# Patient Record
Sex: Male | Born: 1965 | Hispanic: Yes | Marital: Married | Smoking: Former smoker
Health system: Southern US, Community
[De-identification: ages and names within clinical notes are randomized; demographics above are authoritative.]

## PROBLEM LIST (undated history)

## (undated) DIAGNOSIS — S065XAA Traumatic subdural hemorrhage with loss of consciousness status unknown, initial encounter: Secondary | ICD-10-CM

## (undated) DIAGNOSIS — G4733 Obstructive sleep apnea (adult) (pediatric): Secondary | ICD-10-CM

## (undated) DIAGNOSIS — R569 Unspecified convulsions: Secondary | ICD-10-CM

## (undated) DIAGNOSIS — J449 Chronic obstructive pulmonary disease, unspecified: Secondary | ICD-10-CM

## (undated) DIAGNOSIS — I10 Essential (primary) hypertension: Secondary | ICD-10-CM

## (undated) DIAGNOSIS — S065X9A Traumatic subdural hemorrhage with loss of consciousness of unspecified duration, initial encounter: Secondary | ICD-10-CM

## (undated) DIAGNOSIS — Z9989 Dependence on other enabling machines and devices: Secondary | ICD-10-CM

## (undated) HISTORY — PX: BRAIN SURGERY: SHX531

## (undated) HISTORY — PX: OTHER SURGICAL HISTORY: SHX169

---

## 2016-03-24 ENCOUNTER — Emergency Department (HOSPITAL_COMMUNITY)
Admission: EM | Admit: 2016-03-24 | Discharge: 2016-03-24 | Disposition: A | Discharge status: Home / Self Care | Payer: Self-pay | Attending: Emergency Medicine | Admitting: Emergency Medicine

## 2016-03-24 ENCOUNTER — Other Ambulatory Visit: Payer: Self-pay

## 2016-03-24 ENCOUNTER — Encounter (HOSPITAL_COMMUNITY): Payer: Self-pay | Admitting: Emergency Medicine

## 2016-03-24 ENCOUNTER — Emergency Department (HOSPITAL_COMMUNITY): Payer: Self-pay

## 2016-03-24 DIAGNOSIS — I10 Essential (primary) hypertension: Secondary | ICD-10-CM | POA: Insufficient documentation

## 2016-03-24 DIAGNOSIS — J441 Chronic obstructive pulmonary disease with (acute) exacerbation: Secondary | ICD-10-CM

## 2016-03-24 DIAGNOSIS — J449 Chronic obstructive pulmonary disease, unspecified: Secondary | ICD-10-CM | POA: Insufficient documentation

## 2016-03-24 DIAGNOSIS — Z8669 Personal history of other diseases of the nervous system and sense organs: Secondary | ICD-10-CM | POA: Insufficient documentation

## 2016-03-24 DIAGNOSIS — Z87891 Personal history of nicotine dependence: Secondary | ICD-10-CM | POA: Insufficient documentation

## 2016-03-24 HISTORY — DX: Essential (primary) hypertension: I10

## 2016-03-24 HISTORY — DX: Unspecified convulsions: R56.9

## 2016-03-24 HISTORY — DX: Chronic obstructive pulmonary disease, unspecified: J44.9

## 2016-03-24 LAB — BASIC METABOLIC PANEL
Anion gap: 7 (ref 5–15)
BUN: 21 mg/dL — ABNORMAL HIGH (ref 6–20)
CALCIUM: 8.9 mg/dL (ref 8.9–10.3)
CHLORIDE: 103 mmol/L (ref 101–111)
CO2: 27 mmol/L (ref 22–32)
CREATININE: 0.73 mg/dL (ref 0.61–1.24)
GFR calc Af Amer: >60 mL/min (ref >60)
GFR calc non Af Amer: >60 mL/min (ref >60)
GLUCOSE: 124 mg/dL — AB (ref 65–99)
Potassium: 3.5 mmol/L (ref 3.5–5.1)
Sodium: 137 mmol/L (ref 135–145)

## 2016-03-24 LAB — D-DIMER, QUANTITATIVE: D-Dimer, Quant: <0.27 ug/mL-FEU (ref 0.00–0.50)

## 2016-03-24 LAB — CBC WITH DIFFERENTIAL/PLATELET
BASOS PCT: 0 %
Basophils Absolute: 0 10*3/uL (ref 0.0–0.1)
EOS ABS: 0.6 10*3/uL (ref 0.0–0.7)
Eosinophils Relative: 5 %
HEMATOCRIT: 40.4 % (ref 39.0–52.0)
Hemoglobin: 12.9 g/dL — ABNORMAL LOW (ref 13.0–17.0)
LYMPHS ABS: 2.7 10*3/uL (ref 0.7–4.0)
Lymphocytes Relative: 24 %
MCH: 26.3 pg (ref 26.0–34.0)
MCHC: 31.9 g/dL (ref 30.0–36.0)
MCV: 82.4 fL (ref 78.0–100.0)
MONO ABS: 1.1 10*3/uL — AB (ref 0.1–1.0)
MONOS PCT: 10 %
Neutro Abs: 6.7 10*3/uL (ref 1.7–7.7)
Neutrophils Relative %: 61 %
Platelets: 186 10*3/uL (ref 150–400)
RBC: 4.9 MIL/uL (ref 4.22–5.81)
RDW: 14.2 % (ref 11.5–15.5)
WBC: 11 10*3/uL — ABNORMAL HIGH (ref 4.0–10.5)

## 2016-03-24 LAB — TROPONIN I

## 2016-03-24 MED ORDER — ALBUTEROL SULFATE (2.5 MG/3ML) 0.083% IN NEBU
5.0000 mg | INHALATION_SOLUTION | Freq: Once | RESPIRATORY_TRACT | Status: AC
  Administered 2016-03-24: 5 mg via RESPIRATORY_TRACT
  Filled 2016-03-24: qty 6

## 2016-03-24 MED ORDER — ALBUTEROL SULFATE (2.5 MG/3ML) 0.083% IN NEBU
2.5000 mg | INHALATION_SOLUTION | Freq: Once | RESPIRATORY_TRACT | Status: AC
  Administered 2016-03-24: 2.5 mg via RESPIRATORY_TRACT
  Filled 2016-03-24: qty 3

## 2016-03-24 MED ORDER — AEROCHAMBER PLUS FLO-VU MEDIUM MISC
1.0000 | Freq: Once | Status: AC
  Administered 2016-03-24: 1
  Filled 2016-03-24 (×2): qty 1

## 2016-03-24 MED ORDER — PREDNISONE 10 MG (21) PO TBPK: 10.0000 mg | ORAL_TABLET | Freq: Every day | ORAL | Status: DC

## 2016-03-24 MED ORDER — IPRATROPIUM-ALBUTEROL 0.5-2.5 (3) MG/3ML IN SOLN
3.0000 mL | Freq: Once | RESPIRATORY_TRACT | Status: AC
  Administered 2016-03-24: 3 mL via RESPIRATORY_TRACT
  Filled 2016-03-24: qty 3

## 2016-03-24 MED ORDER — METHYLPREDNISOLONE SODIUM SUCC 125 MG IJ SOLR
125.0000 mg | Freq: Once | INTRAMUSCULAR | Status: AC
  Administered 2016-03-24: 125 mg via INTRAVENOUS
  Filled 2016-03-24: qty 2

## 2016-03-24 MED ORDER — ALBUTEROL SULFATE 0.63 MG/3ML IN NEBU: 3.0000 mL | INHALATION_SOLUTION | Freq: Four times a day (QID) | RESPIRATORY_TRACT | Status: AC | PRN

## 2016-03-24 MED ORDER — ALBUTEROL SULFATE HFA 108 (90 BASE) MCG/ACT IN AERS
2.0000 | INHALATION_SPRAY | Freq: Once | RESPIRATORY_TRACT | Status: AC
  Administered 2016-03-24: 2 via RESPIRATORY_TRACT
  Filled 2016-03-24: qty 6.7

## 2016-03-24 NOTE — ED Notes (Signed)
Pt c/o shortness of breath for about a week ago, [place]. Pt has COPD. Denies chest pain. Pt states he used albuterol neb this morning that helped but when he ambulates it worsens.

## 2016-03-24 NOTE — ED Notes (Signed)
MD at bedside. 

## 2016-03-24 NOTE — ED Provider Notes (Signed)
CSN: [id number]     Arrival date & time 03/24/16  0848 History   First MD Initiated Contact with Patient 03/24/16 [phone]     Chief Complaint  Patient presents with  . Shortness of Breath   PT IS HERE WITH SOB.  HE DROVE HERE TO  FROM [place] FOR WORK A FEW DAYS AGO.  PT HAS COPD AND BROUGHT HIS INHALERS WITH HIM.  THE PT SAID THAT HIS ALBUTEROL NEB HAS HELPED, BUT HE IS STILL SOB ESPECIALLY WHEN HE STANDS UP.  (Consider location/radiation/quality/duration/timing/severity/associated sxs/prior Treatment) Patient is a 50 y.o. male presenting with shortness of breath. The history is provided by the patient.  Shortness of Breath Severity:  Moderate Onset quality:  Gradual Timing:  Constant Progression:  Worsening Chronicity:  Recurrent Context: activity   Relieved by:  Nothing Worsened by:  Exertion Ineffective treatments:  Inhaler Associated symptoms: wheezing     Past Medical History  Diagnosis Date  . COPD (chronic obstructive pulmonary disease) (HCC)   . Hypertension   . Seizures (HCC)    No past surgical history on file. CRANIOTOMY No family history on file. Social History  Substance Use Topics  . Smoking status: Former Games developermoker  . Smokeless tobacco: None  . Alcohol Use: None    Review of Systems  Respiratory: Positive for shortness of breath and wheezing.   All other systems reviewed and are negative.     Allergies  Iodine and Penicillins  Home Medications   Prior to Admission medications   Medication Sig Start Date End Date Taking? Authorizing Provider  lisinopril (PRINIVIL,ZESTRIL) 20 MG tablet Take 20 mg by mouth daily.   Yes Historical Provider, MD  methylPREDNISolone (MEDROL DOSEPAK) 4 MG TBPK tablet Take 4 mg by mouth See admin instructions. 6 day dose pack started 06/21 03/21/16  Yes Historical Provider, MD  phenytoin (DILANTIN) 100 MG ER capsule Take 300 mg by mouth at bedtime. Take in addition to 50 mg phenytoin chewable nightly 02/18/16  Yes  Historical Provider, MD  PHENYTOIN INFATABS 50 MG tablet Chew 50 mg by mouth at bedtime. Take in addition to 300 mg phenytoin capsule nightly 02/18/16  Yes Historical Provider, MD  albuterol (ACCUNEB) 0.63 MG/3ML nebulizer solution Take 3 mLs by nebulization every 6 (six) hours as needed for wheezing or shortness of breath. 03/24/16   Jacalyn LefevreJulie Kaiyu Mirabal, MD  predniSONE (STERAPRED UNI-PAK 21 TAB) 10 MG (21) TBPK tablet Take 1 tablet (10 mg total) by mouth daily. Take 6 tabs by mouth daily  for 2 days, then 5 tabs for 2 days, then 4 tabs for 2 days, then 3 tabs for 2 days, 2 tabs for 2 days, then 1 tab by mouth daily for 2 days 03/24/16   Jacalyn LefevreJulie Sehar Sedano, MD   BP 137/85 mmHg  Pulse 104  Temp(Src) 98.3 F (36.8 C) (Oral)  Resp 15  SpO2 92% Physical Exam  Constitutional: He is oriented to person, place, and time. He appears well-developed and well-nourished.  HENT:  Head: Normocephalic.    Right Ear: External ear normal.  Left Ear: External ear normal.  Nose: Nose normal.  Mouth/Throat: Oropharynx is clear and moist.  Eyes: Conjunctivae and EOM are normal. Pupils are equal, round, and reactive to light.  Neck: Normal range of motion. Neck supple.  Cardiovascular: Tachycardia present.   Pulmonary/Chest: He has wheezes.  Abdominal: Soft. Bowel sounds are normal.  Musculoskeletal: Normal range of motion.  Neurological: He is alert and oriented to person, place, and time.  Skin: Skin  is warm and dry.  Psychiatric: He has a normal mood and affect. His behavior is normal. Judgment and thought content normal.  Nursing note and vitals reviewed.   ED Course  Procedures (including critical care time) Labs Review Labs Reviewed  BASIC METABOLIC PANEL - Abnormal; Notable for the following:    Glucose, Bld 124 (*)    BUN 21 (*)    All other components within normal limits  CBC WITH DIFFERENTIAL/PLATELET - Abnormal; Notable for the following:    WBC 11.0 (*)    Hemoglobin 12.9 (*)    Monocytes  Absolute 1.1 (*)    All other components within normal limits  TROPONIN I  D-DIMER, QUANTITATIVE (NOT AT [facility])    Imaging Review Dg Chest 2 View  03/24/2016  CLINICAL DATA:  Shortness of breath for a week, COPD, hypertension, former smoker EXAM: CHEST  2 VIEW COMPARISON:  None FINDINGS: Lordotic positioning. Normal heart size, mediastinal contours, and pulmonary vascularity. Emphysematous and minimal bronchitic changes consistent with history of COPD. No acute infiltrate, pleural effusion, or pneumothorax. Bones unremarkable. IMPRESSION: COPD changes. No acute abnormalities. Electronically Signed   By: Ulyses SouthwardMark  Boles M.D.   On: 03/24/2016 09:34   I have personally reviewed and evaluated these images and lab results as part of my medical decision-making.   EKG Interpretation None      MDM  PT IS FEELING MUCH BETTER.  HE IS AT HIS BASELINE.  THE PT SAID THAT HE IS READY TO GO.  PT SAID THAT HE NEEDS A REFILL FOR HIS ALBUTEROL NEBS AND INHALERS, SO I GAVE HIM A RX FOR THAT.  I ALSO GAVE HIM A RX FOR A PREDNISONE TAPER.  PT KNOWS TO RETURN HERE IF WORSE. Final diagnoses:  COPD exacerbation (HCC)       Jacalyn LefevreJulie Jonia Oakey, MD 03/24/16 1044

## 2016-03-27 ENCOUNTER — Observation Stay (HOSPITAL_COMMUNITY)
Admission: EM | Admit: 2016-03-27 | Discharge: 2016-03-28 | Disposition: A | Discharge status: Home / Self Care | Payer: Self-pay | Attending: Internal Medicine | Admitting: Internal Medicine

## 2016-03-27 ENCOUNTER — Emergency Department (HOSPITAL_COMMUNITY): Payer: Self-pay

## 2016-03-27 ENCOUNTER — Encounter (HOSPITAL_COMMUNITY): Payer: Self-pay | Admitting: *Deleted

## 2016-03-27 DIAGNOSIS — Z79899 Other long term (current) drug therapy: Secondary | ICD-10-CM | POA: Insufficient documentation

## 2016-03-27 DIAGNOSIS — J45901 Unspecified asthma with (acute) exacerbation: Secondary | ICD-10-CM

## 2016-03-27 DIAGNOSIS — I10 Essential (primary) hypertension: Secondary | ICD-10-CM | POA: Diagnosis present

## 2016-03-27 DIAGNOSIS — J441 Chronic obstructive pulmonary disease with (acute) exacerbation: Principal | ICD-10-CM | POA: Diagnosis present

## 2016-03-27 DIAGNOSIS — Z87891 Personal history of nicotine dependence: Secondary | ICD-10-CM | POA: Insufficient documentation

## 2016-03-27 DIAGNOSIS — R0902 Hypoxemia: Secondary | ICD-10-CM | POA: Insufficient documentation

## 2016-03-27 DIAGNOSIS — R569 Unspecified convulsions: Secondary | ICD-10-CM

## 2016-03-27 DIAGNOSIS — Z9989 Dependence on other enabling machines and devices: Secondary | ICD-10-CM

## 2016-03-27 DIAGNOSIS — G4733 Obstructive sleep apnea (adult) (pediatric): Secondary | ICD-10-CM | POA: Diagnosis present

## 2016-03-27 HISTORY — DX: Traumatic subdural hemorrhage with loss of consciousness of unspecified duration, initial encounter: S06.5X9A

## 2016-03-27 HISTORY — DX: Obstructive sleep apnea (adult) (pediatric): G47.33

## 2016-03-27 HISTORY — DX: Traumatic subdural hemorrhage with loss of consciousness status unknown, initial encounter: S06.5XAA

## 2016-03-27 HISTORY — DX: Dependence on other enabling machines and devices: Z99.89

## 2016-03-27 LAB — BASIC METABOLIC PANEL
Anion gap: 7 (ref 5–15)
BUN: 17 mg/dL (ref 6–20)
CALCIUM: 9.1 mg/dL (ref 8.9–10.3)
CO2: 27 mmol/L (ref 22–32)
CREATININE: 0.6 mg/dL — AB (ref 0.61–1.24)
Chloride: 104 mmol/L (ref 101–111)
GFR calc Af Amer: >60 mL/min (ref >60)
GFR calc non Af Amer: >60 mL/min (ref >60)
GLUCOSE: 109 mg/dL — AB (ref 65–99)
Potassium: 3.8 mmol/L (ref 3.5–5.1)
Sodium: 138 mmol/L (ref 135–145)

## 2016-03-27 LAB — CBC
HCT: 38.9 % — ABNORMAL LOW (ref 39.0–52.0)
Hemoglobin: 12.4 g/dL — ABNORMAL LOW (ref 13.0–17.0)
MCH: 26.3 pg (ref 26.0–34.0)
MCHC: 31.9 g/dL (ref 30.0–36.0)
MCV: 82.4 fL (ref 78.0–100.0)
PLATELETS: 207 10*3/uL (ref 150–400)
RBC: 4.72 MIL/uL (ref 4.22–5.81)
RDW: 14.2 % (ref 11.5–15.5)
WBC: 12.8 10*3/uL — ABNORMAL HIGH (ref 4.0–10.5)

## 2016-03-27 MED ORDER — IPRATROPIUM BROMIDE 0.02 % IN SOLN
0.5000 mg | Freq: Once | RESPIRATORY_TRACT | Status: AC
  Administered 2016-03-27: 0.5 mg via RESPIRATORY_TRACT
  Filled 2016-03-27: qty 2.5

## 2016-03-27 MED ORDER — ALBUTEROL SULFATE (2.5 MG/3ML) 0.083% IN NEBU
5.0000 mg | INHALATION_SOLUTION | Freq: Once | RESPIRATORY_TRACT | Status: AC
  Administered 2016-03-27: 5 mg via RESPIRATORY_TRACT
  Filled 2016-03-27: qty 6

## 2016-03-27 MED ORDER — METHYLPREDNISOLONE SODIUM SUCC 125 MG IJ SOLR
60.0000 mg | Freq: Two times a day (BID) | INTRAMUSCULAR | Status: DC
  Administered 2016-03-27 – 2016-03-28 (×2): 60 mg via INTRAVENOUS
  Filled 2016-03-27 (×2): qty 2

## 2016-03-27 MED ORDER — ACETAMINOPHEN 325 MG PO TABS: 650.0000 mg | ORAL_TABLET | Freq: Four times a day (QID) | ORAL | Status: DC | PRN

## 2016-03-27 MED ORDER — METHYLPREDNISOLONE SODIUM SUCC 125 MG IJ SOLR
125.0000 mg | Freq: Once | INTRAMUSCULAR | Status: AC
  Administered 2016-03-27: 125 mg via INTRAVENOUS
  Filled 2016-03-27: qty 2

## 2016-03-27 MED ORDER — LORATADINE 10 MG PO TABS
10.0000 mg | ORAL_TABLET | Freq: Every day | ORAL | Status: DC
  Administered 2016-03-27 – 2016-03-28 (×2): 10 mg via ORAL
  Filled 2016-03-27 (×2): qty 1

## 2016-03-27 MED ORDER — PHENYTOIN SODIUM EXTENDED 100 MG PO CAPS
300.0000 mg | ORAL_CAPSULE | Freq: Every day | ORAL | Status: DC
  Administered 2016-03-27 – 2016-03-28 (×2): 300 mg via ORAL
  Filled 2016-03-27 (×3): qty 3

## 2016-03-27 MED ORDER — ALBUTEROL SULFATE (2.5 MG/3ML) 0.083% IN NEBU: 5.0000 mg | INHALATION_SOLUTION | RESPIRATORY_TRACT | Status: AC | PRN

## 2016-03-27 MED ORDER — IPRATROPIUM-ALBUTEROL 0.5-2.5 (3) MG/3ML IN SOLN
3.0000 mL | RESPIRATORY_TRACT | Status: DC
  Administered 2016-03-27 – 2016-03-28 (×5): 3 mL via RESPIRATORY_TRACT
  Filled 2016-03-27 (×5): qty 3

## 2016-03-27 MED ORDER — ALBUTEROL SULFATE (2.5 MG/3ML) 0.083% IN NEBU: 5.0000 mg | INHALATION_SOLUTION | Freq: Once | RESPIRATORY_TRACT | Status: DC

## 2016-03-27 MED ORDER — PREDNISONE 50 MG PO TABS
50.0000 mg | ORAL_TABLET | Freq: Every day | ORAL | Status: DC
Start: 2016-03-28 — End: 2016-03-27

## 2016-03-27 MED ORDER — MAGNESIUM SULFATE 2 GM/50ML IV SOLN
2.0000 g | Freq: Once | INTRAVENOUS | Status: AC
  Administered 2016-03-27: 2 g via INTRAVENOUS
  Filled 2016-03-27: qty 50

## 2016-03-27 MED ORDER — LISINOPRIL 20 MG PO TABS
20.0000 mg | ORAL_TABLET | Freq: Every day | ORAL | Status: DC
  Administered 2016-03-27 – 2016-03-28 (×2): 20 mg via ORAL
  Filled 2016-03-27 (×2): qty 1

## 2016-03-27 MED ORDER — ALBUTEROL (5 MG/ML) CONTINUOUS INHALATION SOLN
10.0000 mg/h | INHALATION_SOLUTION | Freq: Once | RESPIRATORY_TRACT | Status: AC
  Administered 2016-03-27: 10 mg/h via RESPIRATORY_TRACT
  Filled 2016-03-27: qty 20

## 2016-03-27 MED ORDER — IPRATROPIUM BROMIDE 0.02 % IN SOLN: 0.5000 mg | Freq: Once | RESPIRATORY_TRACT | Status: DC

## 2016-03-27 MED ORDER — CHLORHEXIDINE GLUCONATE 0.12 % MT SOLN: 15.0000 mL | Freq: Two times a day (BID) | OROMUCOSAL | Status: DC

## 2016-03-27 MED ORDER — PHENYTOIN 50 MG PO CHEW
50.0000 mg | CHEWABLE_TABLET | Freq: Every day | ORAL | Status: DC
  Administered 2016-03-27 – 2016-03-28 (×2): 50 mg via ORAL
  Filled 2016-03-27 (×3): qty 1

## 2016-03-27 MED ORDER — ACETAMINOPHEN 650 MG RE SUPP: 650.0000 mg | Freq: Four times a day (QID) | RECTAL | Status: DC | PRN

## 2016-03-27 MED ORDER — ENOXAPARIN SODIUM 40 MG/0.4ML ~~LOC~~ SOLN
40.0000 mg | Freq: Every day | SUBCUTANEOUS | Status: DC
  Administered 2016-03-27 – 2016-03-28 (×2): 40 mg via SUBCUTANEOUS
  Filled 2016-03-27 (×2): qty 0.4

## 2016-03-27 MED ORDER — ALBUTEROL SULFATE (2.5 MG/3ML) 0.083% IN NEBU
2.5000 mg | INHALATION_SOLUTION | Freq: Four times a day (QID) | RESPIRATORY_TRACT | Status: DC
  Administered 2016-03-27: 2.5 mg via RESPIRATORY_TRACT
  Filled 2016-03-27: qty 3

## 2016-03-27 NOTE — ED Notes (Signed)
Pt c/o shortness of breath for 1 week; pt states that it has been getting progressively worse; pt with audible wheezing; pt states cough;pt with hx of COPD

## 2016-03-27 NOTE — H&P (Addendum)
History and Physical  Patient Name: Rodney Simmons     ZOX:[id number]    DOB: Sep 29, 1966    DOA: 03/27/2016 PCP: No primary care provider on file. [place] in [place]  Patient coming from: [place] (on work trip from [place])  Chief Complaint: Shortness of breath  HPI: Rodney Simmons is a 50 y.o. male with a past medical history significant for asthma, HTN, and hx of seizures who presents with shortness of breath and wheezing.  The patient was in his usual health (uses albuterol daily, and has a "red inhaler", either Proair or Symbicort?) until this week. 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    He first went to [facility] and picked up an old prescription for steroid (he doesn't know what) that his PCP back home had sent in for him and started that on Tuesday or so. After two days he was still suffering so he came to the ER where he was given nebulizers and Solumedrol and discharged again with prednisone which he has been taking. However, his symptoms didn't get better and so he came back to the ER today.   He has a long history of allergies and was allergy tested as a child. He doesn't know of specific allergens. He had a similar bad asthma flare a year ago when he drove to [place] for a similar job assignment.  ED course: -Afebrile, tachycardic, SpO2 normal on room air -Discovered his albuterol he was using was past expiration date -Na 138, K 3.8, Cr 0.6, WBC 12.8K, Hgb 12.4 -CXR clear -He was given magnesium, Solumedrol, and Duo-Nebs and TRH were asked to evaluate for admission     ROS: Pt complains of wheezing, cough, chest tightness, dyspnea, history of allergies.  Pt denies any orthopnea, leg swelling, chest pain, PND.    All other systems negative except as just noted or noted in the history of present illness.    Past Medical History    Diagnosis Date  . COPD (chronic obstructive pulmonary disease) (HCC)   . Hypertension   . Seizures (HCC)   . Subdural hematoma (HCC)   . OSA on CPAP     Past Surgical History  Procedure Laterality Date  . Left arm surgery    . Brain surgery      Social History: Patient lives with his wife and son.  The patient walks unassisted.  He is a former smoker. He lives in [place] and works for [employer].      Allergies  Allergen Reactions  . Iodine Other (See Comments)    Reaction: unknown   . Penicillins Other (See Comments)    Reaction: unknown  Has patient had a PCN reaction causing immediate rash, facial/tongue/throat swelling, SOB or lightheadedness with hypotension: n/a Has patient had a PCN reaction causing severe rash involving mucus membranes or skin necrosis: n/a Has patient had a PCN reaction that required hospitalization: n/a Has patient had a PCN reaction occurring within the last 10 years: n/a If all of the above answers are "NO", then may proceed with Cephalosporin use.     Family history: family history includes Aneurysm in his mother; Lung cancer in his father.  Prior to Admission medications   Medication Sig Start Date End Date Taking? Authorizing Provider  albuterol (ACCUNEB) 0.63 MG/3ML nebulizer solution Take 3 mLs by nebulization every 6 (  six) hours as needed for wheezing or shortness of breath. 03/24/16  Yes Jacalyn LefevreJulie Haviland, MD  ibuprofen (ADVIL,MOTRIN) 200 MG tablet Take 400 mg by mouth every 6 (six) hours as needed for moderate pain.   Yes Historical Provider, MD  lisinopril (PRINIVIL,ZESTRIL) 20 MG tablet Take 20 mg by mouth daily.   Yes Historical Provider, MD  phenytoin (DILANTIN) 100 MG ER capsule Take 300 mg by mouth at bedtime. Take in addition to 50 mg phenytoin chewable nightly 02/18/16  Yes Historical Provider, MD  PHENYTOIN INFATABS 50 MG tablet Chew 50 mg by mouth at bedtime. Take in addition to 300 mg phenytoin capsule nightly 02/18/16  Yes Historical  Provider, MD  predniSONE (STERAPRED UNI-PAK 21 TAB) 10 MG (21) TBPK tablet Take 1 tablet (10 mg total) by mouth daily. Take 6 tabs by mouth daily  for 2 days, then 5 tabs for 2 days, then 4 tabs for 2 days, then 3 tabs for 2 days, 2 tabs for 2 days, then 1 tab by mouth daily for 2 days 03/24/16  Yes Jacalyn LefevreJulie Haviland, MD       Physical Exam: BP 161/98 mmHg  Pulse 103  Temp(Src) 98.4 F (36.9 C) (Oral)  Resp 24  Ht 5\' 9"  (1.753 m)  Wt 122.471 kg (270 lb)  BMI 39.85 kg/m2  SpO2 92% General appearance: Well-developed, obese  male, alert and in no acute distress.   Eyes: Conjunctiva normal, lids and lashes normal.   PERRL.  ENT: No nasal deformity, discharge.  OP moist without lesions.   Lymph: No cervical or supraclavicular lymphadenopathy. Skin: Warm and dry.  No suspicious rashes or lesions. Cardiac: Tachycardic, regular, nl S1-S2, no murmurs appreciated.  Capillary refill is brisk.  JVP not visible.  No LE edema.  Radial and DP pulses 2+ and symmetric. Respiratory: Normal respiratory rate and rhythm.  Expiratory wheezes diffusely. GI: Abdomen soft without rigidity.  No TTP. No ascites, distension, hepatosplenomegaly.   MSK: No deformities or effusions.  No clubbing/cyanosis. Neuro: Cranial nerves normal.  Sensorium intact and responding to questions, attention normal.  Speech is fluent.  Moves all extremities equally and with normal coordination.    Psych: Affect normal.  Judgment and insight appear normal.       Labs on Admission:  I have personally reviewed following labs and imaging studies: CBC:  Recent Labs Lab 03/24/16 0939 03/27/16 0626  WBC 11.0* 12.8*  NEUTROABS 6.7  --   HGB 12.9* 12.4*  HCT 40.4 38.9*  MCV 82.4 82.4  PLT 186 207   Basic Metabolic Panel:  Recent Labs Lab 03/24/16 0939 03/27/16 0626  NA 137 138  K 3.5 3.8  CL 103 104  CO2 27 27  GLUCOSE 124* 109*  BUN 21* 17  CREATININE 0.73 0.60*  CALCIUM 8.9 9.1   GFR: Estimated Creatinine  Clearance: 144.4 mL/min (by C-G formula based on Cr of 0.6).  Cardiac Enzymes:  Recent Labs Lab 03/24/16 0939  TROPONINI <0.03         Radiological Exams on Admission: Personally reviewed: Dg Chest 2 View  03/27/2016  CLINICAL DATA:  Worsening dyspnea over the past week EXAM: CHEST  2 VIEW COMPARISON:  03/24/2016 FINDINGS: The heart size and mediastinal contours are within normal limits. Both lungs are clear. The visualized skeletal structures are unremarkable. IMPRESSION: No active cardiopulmonary disease. Electronically Signed   By: Ellery Plunkaniel R Mitchell M.D.   On: 03/27/2016 06:17        Assessment/Plan  1. Asthma exacerbation:  Doubt CHF wheeze.  PE is doubted despite long car ride given wheeze and improvement with albuterol and no leg swelling. History of allergic tendency, suspect allergic trigger to asthma flare. -Solumedrol IV 60 q12 hrs, may need prolonged prednisone taper at discharge -Albuterol scheduled and PRN -Start loratadine 10 mg daily   2. HTN:  -Continue home lisinopril  3. Hx of seizures:  -Continue home dilantin  4. OSA:  -Continue home CPAP      DVT prophylaxis: Lovenox  Code Status: FULL  Family Communication: None present  Disposition Plan: Anticipate observation overnight, scheduled and PRN nebs, and possible discharge tomorrow, patient is eager to get back to work so that he can get home. Consults called: None Admission status: OBS, med surg At the point of initial evaluation, it is my clinical opinion that admission for OBSERVATION is reasonable and necessary because the patient's presenting complaints in the context of their chronic conditions represent sufficient risk of deterioration or significant morbidity to constitute reasonable grounds for close observation in the hospital setting, but that the patient may be medically stable for discharge from the hospital within 24 to 48 hours.    Medical decision making: Patient seen at 1:07 PM on  03/27/2016.  The patient was discussed with [place], PA-C. Clinical condition: stable.        Alberteen SamChristopher P Maxamillion Banas [facility] Pager [phone]

## 2016-03-27 NOTE — ED Provider Notes (Signed)
CSN: [id number]     Arrival date & time 03/27/16  0549 History   First MD Initiated Contact with Patient 03/27/16 0630     Chief Complaint  Patient presents with  . Shortness of Breath     (Consider location/radiation/quality/duration/timing/severity/associated sxs/prior Treatment) HPI Comments: Patient with a history of COPD presents today with complaints of wheezing and SOB.  He reports onset of symptoms one week ago.  Symptoms gradually worsening since that time.  He has been using his Albuterol inhaler and taking oral Prednisone without improvement of symptoms.  He was seen in the ED three days ago for these symptoms, but was discharged due to his symptoms improving after getting breathing treatments.  He reports associated productive cough.  He denies chest pain, fever, chills, hemoptysis, or any other symptoms at this time.  He does not currently smoke.  He quit smoking 5-6 years ago.  The history is provided by the patient.    Past Medical History  Diagnosis Date  . COPD (chronic obstructive pulmonary disease) (HCC)   . Hypertension   . Seizures (HCC)    History reviewed. No pertinent past surgical history. No family history on file. Social History  Substance Use Topics  . Smoking status: Former Games developermoker  . Smokeless tobacco: None  . Alcohol Use: No    Review of Systems  All other systems reviewed and are negative.     Allergies  Iodine and Penicillins  Home Medications   Prior to Admission medications   Medication Sig Start Date End Date Taking? Authorizing Provider  albuterol (ACCUNEB) 0.63 MG/3ML nebulizer solution Take 3 mLs by nebulization every 6 (six) hours as needed for wheezing or shortness of breath. 03/24/16  Yes Jacalyn LefevreJulie Haviland, MD  ibuprofen (ADVIL,MOTRIN) 200 MG tablet Take 400 mg by mouth every 6 (six) hours as needed for moderate pain.   Yes Historical Provider, MD  lisinopril (PRINIVIL,ZESTRIL) 20 MG tablet Take 20 mg by mouth daily.   Yes Historical  Provider, MD  phenytoin (DILANTIN) 100 MG ER capsule Take 300 mg by mouth at bedtime. Take in addition to 50 mg phenytoin chewable nightly 02/18/16  Yes Historical Provider, MD  PHENYTOIN INFATABS 50 MG tablet Chew 50 mg by mouth at bedtime. Take in addition to 300 mg phenytoin capsule nightly 02/18/16  Yes Historical Provider, MD  predniSONE (STERAPRED UNI-PAK 21 TAB) 10 MG (21) TBPK tablet Take 1 tablet (10 mg total) by mouth daily. Take 6 tabs by mouth daily  for 2 days, then 5 tabs for 2 days, then 4 tabs for 2 days, then 3 tabs for 2 days, 2 tabs for 2 days, then 1 tab by mouth daily for 2 days 03/24/16  Yes Jacalyn LefevreJulie Haviland, MD   BP 151/84 mmHg  Pulse 110  Temp(Src) 98.1 F (36.7 C) (Oral)  Resp 22  Ht 5\' 9"  (1.753 m)  Wt 122.471 kg  BMI 39.85 kg/m2  SpO2 100% Physical Exam  Constitutional: He appears well-developed and well-nourished.  HENT:  Head: Normocephalic and atraumatic.  Neck: Normal range of motion. Neck supple.  Cardiovascular: Normal rate, regular rhythm and normal heart sounds.   Pulmonary/Chest: Effort normal. No respiratory distress. He has wheezes. He has no rales. He exhibits no tenderness.  Diffuse inspiratory and expiratory wheezing Patient able to speak in complete sentences  Musculoskeletal: Normal range of motion.  Neurological: He is alert.  Skin: Skin is warm and dry.  Psychiatric: He has a normal mood and affect.  Nursing note and  vitals reviewed.   ED Course  Procedures (including critical care time) Labs Review Labs Reviewed  BASIC METABOLIC PANEL - Abnormal; Notable for the following:    Glucose, Bld 109 (*)    Creatinine, Ser 0.60 (*)    All other components within normal limits  CBC - Abnormal; Notable for the following:    WBC 12.8 (*)    Hemoglobin 12.4 (*)    HCT 38.9 (*)    All other components within normal limits    Imaging Review Dg Chest 2 View  03/27/2016  CLINICAL DATA:  Worsening dyspnea over the past week EXAM: CHEST  2 VIEW  COMPARISON:  03/24/2016 FINDINGS: The heart size and mediastinal contours are within normal limits. Both lungs are clear. The visualized skeletal structures are unremarkable. IMPRESSION: No active cardiopulmonary disease. Electronically Signed   By: Ellery Plunkaniel R Mitchell M.D.   On: 03/27/2016 06:17   I have personally reviewed and evaluated these images and lab results as part of my medical decision-making.   EKG Interpretation None     CRITICAL CARE Performed by: Santiago GladLaisure, Cameron Schwinn   Total critical care time: 30 minutes  Critical care time was exclusive of separately billable procedures and treating other patients.  Critical care was necessary to treat or prevent imminent or life-threatening deterioration.  Critical care was time spent personally by me on the following activities: development of treatment plan with patient and/or surrogate as well as nursing, discussions with consultants, evaluation of patient's response to treatment, examination of patient, obtaining history from patient or surrogate, ordering and performing treatments and interventions, ordering and review of laboratory studies, ordering and review of radiographic studies, pulse oximetry and re-evaluation of patient's condition. 8:10 AM Reassessed patient after continuous neb.  Continues to have diffuse wheezing and be SOB.  Will order Mag. 9:34 AM Reassessed patient.  Oxygen sats 89 on RA while lying in bed.  Lungs with diffuse wheezing. 10:10 AM Discussed with Hospitalist who agreed to admit the patient.   MDM   Final diagnoses:  None   Patient with a history of COPD presents today with wheezing and SOB onset one week ago.  He is currently on Oral Prednisone and Albuterol inhaler, but does not feel that it is helping.  Patient given 2 Duoneb treatments, continuous neb, and 125 mg Solumedrol with some improvement in symptoms.  However, he still felt short of breath and had diffuse wheezing.  Patient then given Magnesium  IV.  However, he continued to be short of breath.  Patient hypoxic on RA.  Therefore, put on 2 L [place] Oxygen.  Patient admitted to [facility] for further management of COPD exacerbation.      Santiago GladHeather Sravya Grissom, PA-C 03/29/16 2344  Santiago GladHeather Cardarius Senat, PA-C 03/29/16 2349  Tilden FossaElizabeth Rees, MD 03/31/16 1145

## 2016-03-28 DIAGNOSIS — J45901 Unspecified asthma with (acute) exacerbation: Secondary | ICD-10-CM

## 2016-03-28 DIAGNOSIS — J441 Chronic obstructive pulmonary disease with (acute) exacerbation: Secondary | ICD-10-CM

## 2016-03-28 MED ORDER — ALBUTEROL SULFATE HFA 108 (90 BASE) MCG/ACT IN AERS
2.0000 | INHALATION_SPRAY | Freq: Four times a day (QID) | RESPIRATORY_TRACT | Status: AC | PRN
Start: 2016-03-28 — End: ?

## 2016-03-28 MED ORDER — FLUTICASONE-SALMETEROL 250-50 MCG/DOSE IN AEPB: 1.0000 | INHALATION_SPRAY | Freq: Two times a day (BID) | RESPIRATORY_TRACT | Status: AC

## 2016-03-28 MED ORDER — PREDNISONE 5 MG PO TABS: ORAL_TABLET | ORAL | Status: AC

## 2016-03-28 MED ORDER — LORATADINE 10 MG PO TABS: 10.0000 mg | ORAL_TABLET | Freq: Every day | ORAL | Status: AC

## 2016-03-28 NOTE — Discharge Summary (Signed)
Rodney Simmons PIR:[id number] DOB: May 30, 1966 DOA: 03/27/2016  PCP: No primary care provider on file.  Admit date: 03/27/2016  Discharge date: 03/28/2016  Admitted From: Home   Disposition:  Home   Recommendations for Outpatient Follow-up:   Follow up with PCP in 1-2 weeks  Please obtain BMP/CBC in one week, 2 view CXR ( see Discharge instructions)   Please follow up on the following pending results: None   Home Health: None   Equipment/Devices: None  Discharge Condition: Stable   CODE STATUS: Full   Diet Recommendation: Heart Healthy  Consultations: None  Brief history of present illness from the day of admission and additional interim summary    Dravyn Severs is a 50 y.o. male with a past medical history significant for asthma, HTN, and hx of seizures who presents with shortness of breath and wheezing.  The patient was in his usual health (uses albuterol daily, and has a "red inhaler", either Proair or Symbicort?) until this week. 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   He first went to [facility] and picked up an old prescription for steroid (he doesn't know what) that his PCP back home had sent in for him and started that on Tuesday or so. After two days he was still suffering so he came to the ER where he was given nebulizers and Solumedrol and discharged again with prednisone which he has been taking. However, his symptoms didn't get better and so he came back to the ER today.   He has a long history of allergies and was allergy tested as a child. He doesn't know of specific allergens. He had a similar bad asthma flare a year ago when he drove to [place] for a  similar job assignment.  ED course: -Afebrile, tachycardic, SpO2 normal on room air -Discovered his albuterol he was using was past expiration date -Na 138, K 3.8, Cr 0.6, WBC 12.8K, Hgb 12.4 -CXR clear -He was given magnesium, Solumedrol, and Duo-Nebs and TRH were asked to evaluate for admission   Hospital issues addressed    1. Asthma exacerbation: Exam consistent with asthma exacerbation, symptoms and wheeze resolved with albuterol & steroids. Has history of allergic tendency, suspect allergic trigger to asthma flare. Now at baseline and wants to go home, placed on Po steroids, Claritin, Advair and Albuterol, follow with PCP in [place]. D-Dimer was 0.27.  2. HTN: Continue home lisinopril  3. Hx of seizures: Continue home dilantin  4. OSA: Continue home CPAP    Discharge diagnosis     Principal Problem:   Asthma, chronic obstructive, with acute exacerbation (HCC) Active Problems:   Essential hypertension   Seizures (HCC)   OSA on CPAP    Discharge instructions    Discharge Instructions    Diet - low sodium heart healthy    Complete by:  As directed  Discharge instructions    Complete by:  As directed   Follow with Primary MD  in 7-10 days   Get CBC, CMP, 2 view Chest X ray checked  by Primary MD next visit ( we routinely change or add medications that can affect your baseline labs and fluid status, therefore we recommend that you get the mentioned basic workup next visit with your PCP, your PCP may decide not to get them or add new tests based on their clinical decision)   Activity: As tolerated with Full fall precautions use walker/cane & assistance as needed   Disposition Home     Diet:   Heart Healthy    For Heart failure patients - Check your Weight same time everyday, if you gain over 2 pounds, or you develop in leg swelling, experience more shortness of breath or chest pain, call your Primary MD immediately. Follow Cardiac Low Salt Diet and 1.5  lit/day fluid restriction.   On your next visit with your primary care physician please Get Medicines reviewed and adjusted.   Please request your Prim.MD to go over all Hospital Tests and Procedure/Radiological results at the follow up, please get all Hospital records sent to your Prim MD by signing hospital release before you go home.   If you experience worsening of your admission symptoms, develop shortness of breath, life threatening emergency, suicidal or homicidal thoughts you must seek medical attention immediately by calling 911 or calling your MD immediately  if symptoms less severe.  You Must read complete instructions/literature along with all the possible adverse reactions/side effects for all the Medicines you take and that have been prescribed to you. Take any new Medicines after you have completely understood and accpet all the possible adverse reactions/side effects.   Do not drive, operate heavy machinery, perform activities at heights, swimming or participation in water activities or provide baby sitting services if your were admitted for syncope or siezures until you have seen by Primary MD or a Neurologist and advised to do so again.  Do not drive when taking Pain medications.    Do not take more than prescribed Pain, Sleep and Anxiety Medications  Special Instructions: If you have smoked or chewed Tobacco  in the last 2 yrs please stop smoking, stop any regular Alcohol  and or any Recreational drug use.  Wear Seat belts while driving.   Please note  You were cared for by a hospitalist during your hospital stay. If you have any questions about your discharge medications or the care you received while you were in the hospital after you are discharged, you can call the unit and asked to speak with the hospitalist on call if the hospitalist that took care of you is not available. Once you are discharged, your primary care physician will handle any further medical issues.  Please note that NO REFILLS for any discharge medications will be authorized once you are discharged, as it is imperative that you return to your primary care physician (or establish a relationship with a primary care physician if you do not have one) for your aftercare needs so that they can reassess your need for medications and monitor your lab values.     Increase activity slowly    Complete by:  As directed            Discharge Medications     Medication List    TAKE these medications        albuterol 0.63 MG/3ML nebulizer solution  Commonly known as:  ACCUNEB  Take 3 mLs by nebulization every 6 (six) hours as needed for wheezing or shortness of breath.     albuterol 108 (90 Base) MCG/ACT inhaler  Commonly known as:  PROVENTIL HFA;VENTOLIN HFA  Inhale 2 puffs into the lungs every 6 (six) hours as needed for wheezing or shortness of breath.     Fluticasone-Salmeterol 250-50 MCG/DOSE Aepb  Commonly known as:  ADVAIR DISKUS  Inhale 1 puff into the lungs 2 (two) times daily.     ibuprofen 200 MG tablet  Commonly known as:  ADVIL,MOTRIN  Take 400 mg by mouth every 6 (six) hours as needed for moderate pain.     lisinopril 20 MG tablet  Commonly known as:  PRINIVIL,ZESTRIL  Take 20 mg by mouth daily.     loratadine 10 MG tablet  Commonly known as:  CLARITIN  Take 1 tablet (10 mg total) by mouth daily.     phenytoin 100 MG ER capsule  Commonly known as:  DILANTIN  Take 300 mg by mouth at bedtime. Take in addition to 50 mg phenytoin chewable nightly     PHENYTOIN INFATABS 50 MG tablet  Generic drug:  phenytoin  Chew 50 mg by mouth at bedtime. Take in addition to 300 mg phenytoin capsule nightly     predniSONE 5 MG tablet  Commonly known as:  DELTASONE  Label  & dispense according to the schedule below. 10 Pills PO for 3 days then, 8 Pills PO for 3 days, 6 Pills PO for 3 days, 4 Pills PO for 3 days, 2 Pills PO for 3 days, 1 Pills PO for 3 days, 1/2 Pill  PO for 3 days then  STOP. Total 95 pills.        Allergies  Allergen Reactions  . Iodine Other (See Comments)    Reaction: unknown   . Penicillins Other (See Comments)    Reaction: unknown  Has patient had a PCN reaction causing immediate rash, facial/tongue/throat swelling, SOB or lightheadedness with hypotension: n/a Has patient had a PCN reaction causing severe rash involving mucus membranes or skin necrosis: n/a Has patient had a PCN reaction that required hospitalization: n/a Has patient had a PCN reaction occurring within the last 10 years: n/a If all of the above answers are "NO", then may proceed with Cephalosporin use.         Follow-up Information    Schedule an appointment as soon as possible for a visit with PCP in 7-10 days.       Major procedures and Radiology Reports - PLEASE review detailed and final reports for all details, in brief -      Dg Chest 2 View  03/27/2016  CLINICAL DATA:  Worsening dyspnea over the past week EXAM: CHEST  2 VIEW COMPARISON:  03/24/2016 FINDINGS: The heart size and mediastinal contours are within normal limits. Both lungs are clear. The visualized skeletal structures are unremarkable. IMPRESSION: No active cardiopulmonary disease. Electronically Signed   By: Ellery Plunk M.D.   On: 03/27/2016 06:17   Dg Chest 2 View  03/24/2016  CLINICAL DATA:  Shortness of breath for a week, COPD, hypertension, former smoker EXAM: CHEST  2 VIEW COMPARISON:  None FINDINGS: Lordotic positioning. Normal heart size, mediastinal contours, and pulmonary vascularity. Emphysematous and minimal bronchitic changes consistent with history of COPD. No acute infiltrate, pleural effusion, or pneumothorax. Bones unremarkable. IMPRESSION: COPD changes. No acute abnormalities. Electronically Signed   By: Angelyn Punt.D.  On: 03/24/2016 09:34    Micro Results      No results found for this or any previous visit (from the past 240 hour(s)).  Today   Subjective    Harlene SaltsRicardo  Loconte today has no headache,no chest abdominal pain,no new weakness tingling or numbness, feels much better wants to go home today.    Objective   Blood pressure 150/90, pulse 90, temperature 98.5 F (36.9 C), temperature source Oral, resp. rate 20, height 5\' 9"  (1.753 m), weight 122.471 kg (270 lb), SpO2 93 % RA.  No intake or output data in the 24 hours ending 03/28/16 0953  Exam Awake Alert, Oriented x 3, No new F.N deficits, Normal affect Wise.AT,PERRAL Supple Neck,No JVD, No cervical lymphadenopathy appriciated.  Symmetrical Chest wall movement, Good air movement bilaterally, CTAB RRR,No Gallops,Rubs or new Murmurs, No Parasternal Heave +ve B.Sounds, Abd Soft, Non tender, No organomegaly appriciated, No rebound -guarding or rigidity. No Cyanosis, Clubbing or edema, No new Rash or bruise   Data Review   CBC w Diff:  Lab Results  Component Value Date   WBC 12.8* 03/27/2016   HGB 12.4* 03/27/2016   HCT 38.9* 03/27/2016   PLT 207 03/27/2016   LYMPHOPCT 24 03/24/2016   MONOPCT 10 03/24/2016   EOSPCT 5 03/24/2016   BASOPCT 0 03/24/2016    CMP:  Lab Results  Component Value Date   NA 138 03/27/2016   K 3.8 03/27/2016   CL 104 03/27/2016   CO2 27 03/27/2016   BUN 17 03/27/2016   CREATININE 0.60* 03/27/2016  .   Total Time in preparing paper work, data evaluation and todays exam - 35 minutes  Leroy SeaSINGH,PRASHANT K M.D on 03/28/2016 at 9:53 AM  [facility]   Office  [phone]

## 2016-03-28 NOTE — Discharge Instructions (Signed)
Follow with Primary MD  in 7-10 days   Get CBC, CMP, 2 view Chest X ray checked  by Primary MD next visit ( we routinely change or add medications that can affect your baseline labs and fluid status, therefore we recommend that you get the mentioned basic workup next visit with your PCP, your PCP may decide not to get them or add new tests based on their clinical decision)   Activity: As tolerated with Full fall precautions use walker/cane & assistance as needed   Disposition Home     Diet:   Heart Healthy    For Heart failure patients - Check your Weight same time everyday, if you gain over 2 pounds, or you develop in leg swelling, experience more shortness of breath or chest pain, call your Primary MD immediately. Follow Cardiac Low Salt Diet and 1.5 lit/day fluid restriction.   On your next visit with your primary care physician please Get Medicines reviewed and adjusted.   Please request your Prim.MD to go over all Hospital Tests and Procedure/Radiological results at the follow up, please get all Hospital records sent to your Prim MD by signing hospital release before you go home.   If you experience worsening of your admission symptoms, develop shortness of breath, life threatening emergency, suicidal or homicidal thoughts you must seek medical attention immediately by calling 911 or calling your MD immediately  if symptoms less severe.  You Must read complete instructions/literature along with all the possible adverse reactions/side effects for all the Medicines you take and that have been prescribed to you. Take any new Medicines after you have completely understood and accpet all the possible adverse reactions/side effects.   Do not drive, operate heavy machinery, perform activities at heights, swimming or participation in water activities or provide baby sitting services if your were admitted for syncope or siezures until you have seen by Primary MD or a Neurologist and advised to  do so again.  Do not drive when taking Pain medications.    Do not take more than prescribed Pain, Sleep and Anxiety Medications  Special Instructions: If you have smoked or chewed Tobacco  in the last 2 yrs please stop smoking, stop any regular Alcohol  and or any Recreational drug use.  Wear Seat belts while driving.   Please note  You were cared for by a hospitalist during your hospital stay. If you have any questions about your discharge medications or the care you received while you were in the hospital after you are discharged, you can call the unit and asked to speak with the hospitalist on call if the hospitalist that took care of you is not available. Once you are discharged, your primary care physician will handle any further medical issues. Please note that NO REFILLS for any discharge medications will be authorized once you are discharged, as it is imperative that you return to your primary care physician (or establish a relationship with a primary care physician if you do not have one) for your aftercare needs so that they can reassess your need for medications and monitor your lab values.

## 2017-10-18 IMAGING — CR DG CHEST 2V
2 series · 2 of 2 positions shown · non-contrast
Comparison: None

CLINICAL DATA: Shortness of breath for a week, COPD, hypertension,
former smoker

EXAM:
CHEST  2 VIEW

[w chest pa]
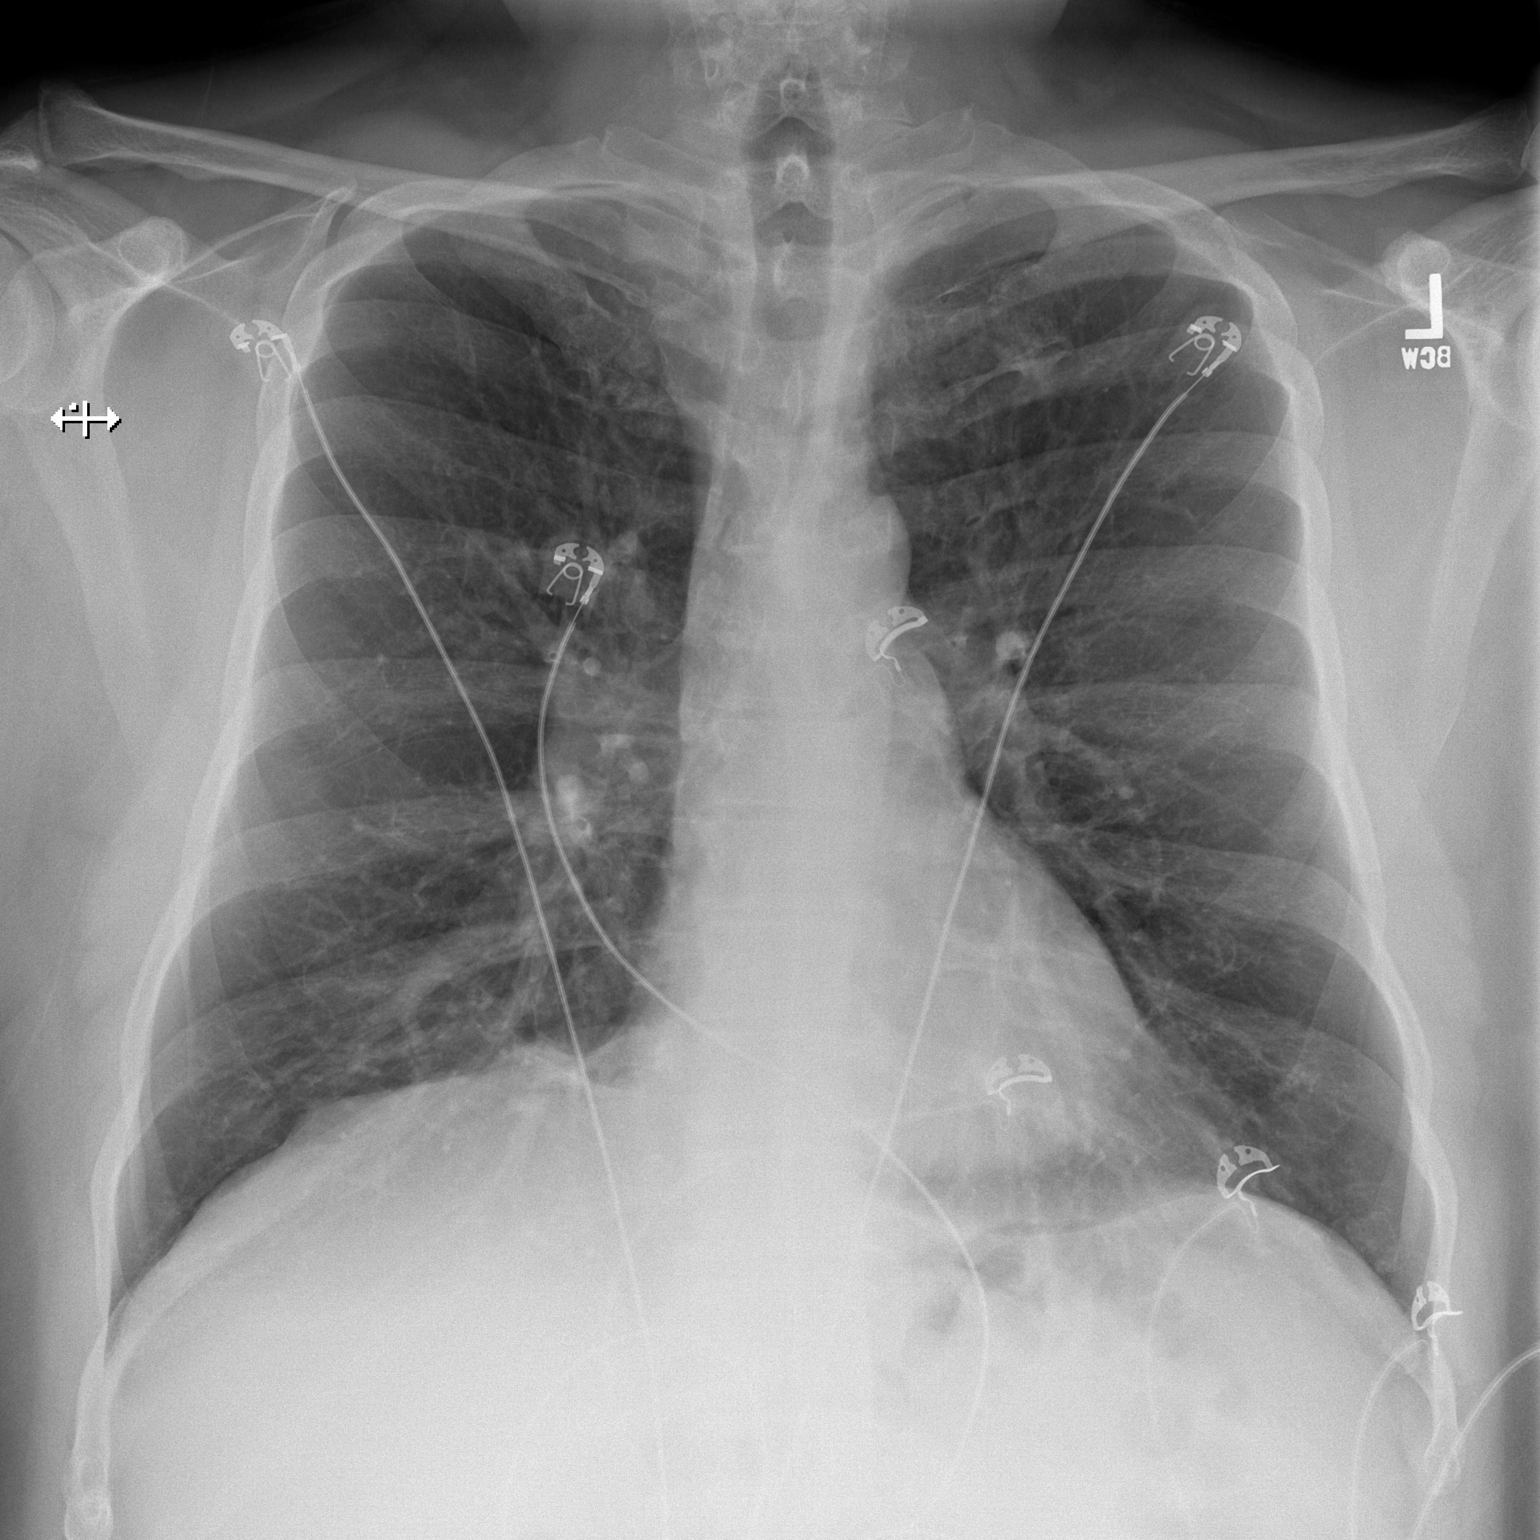

[w chest lat]
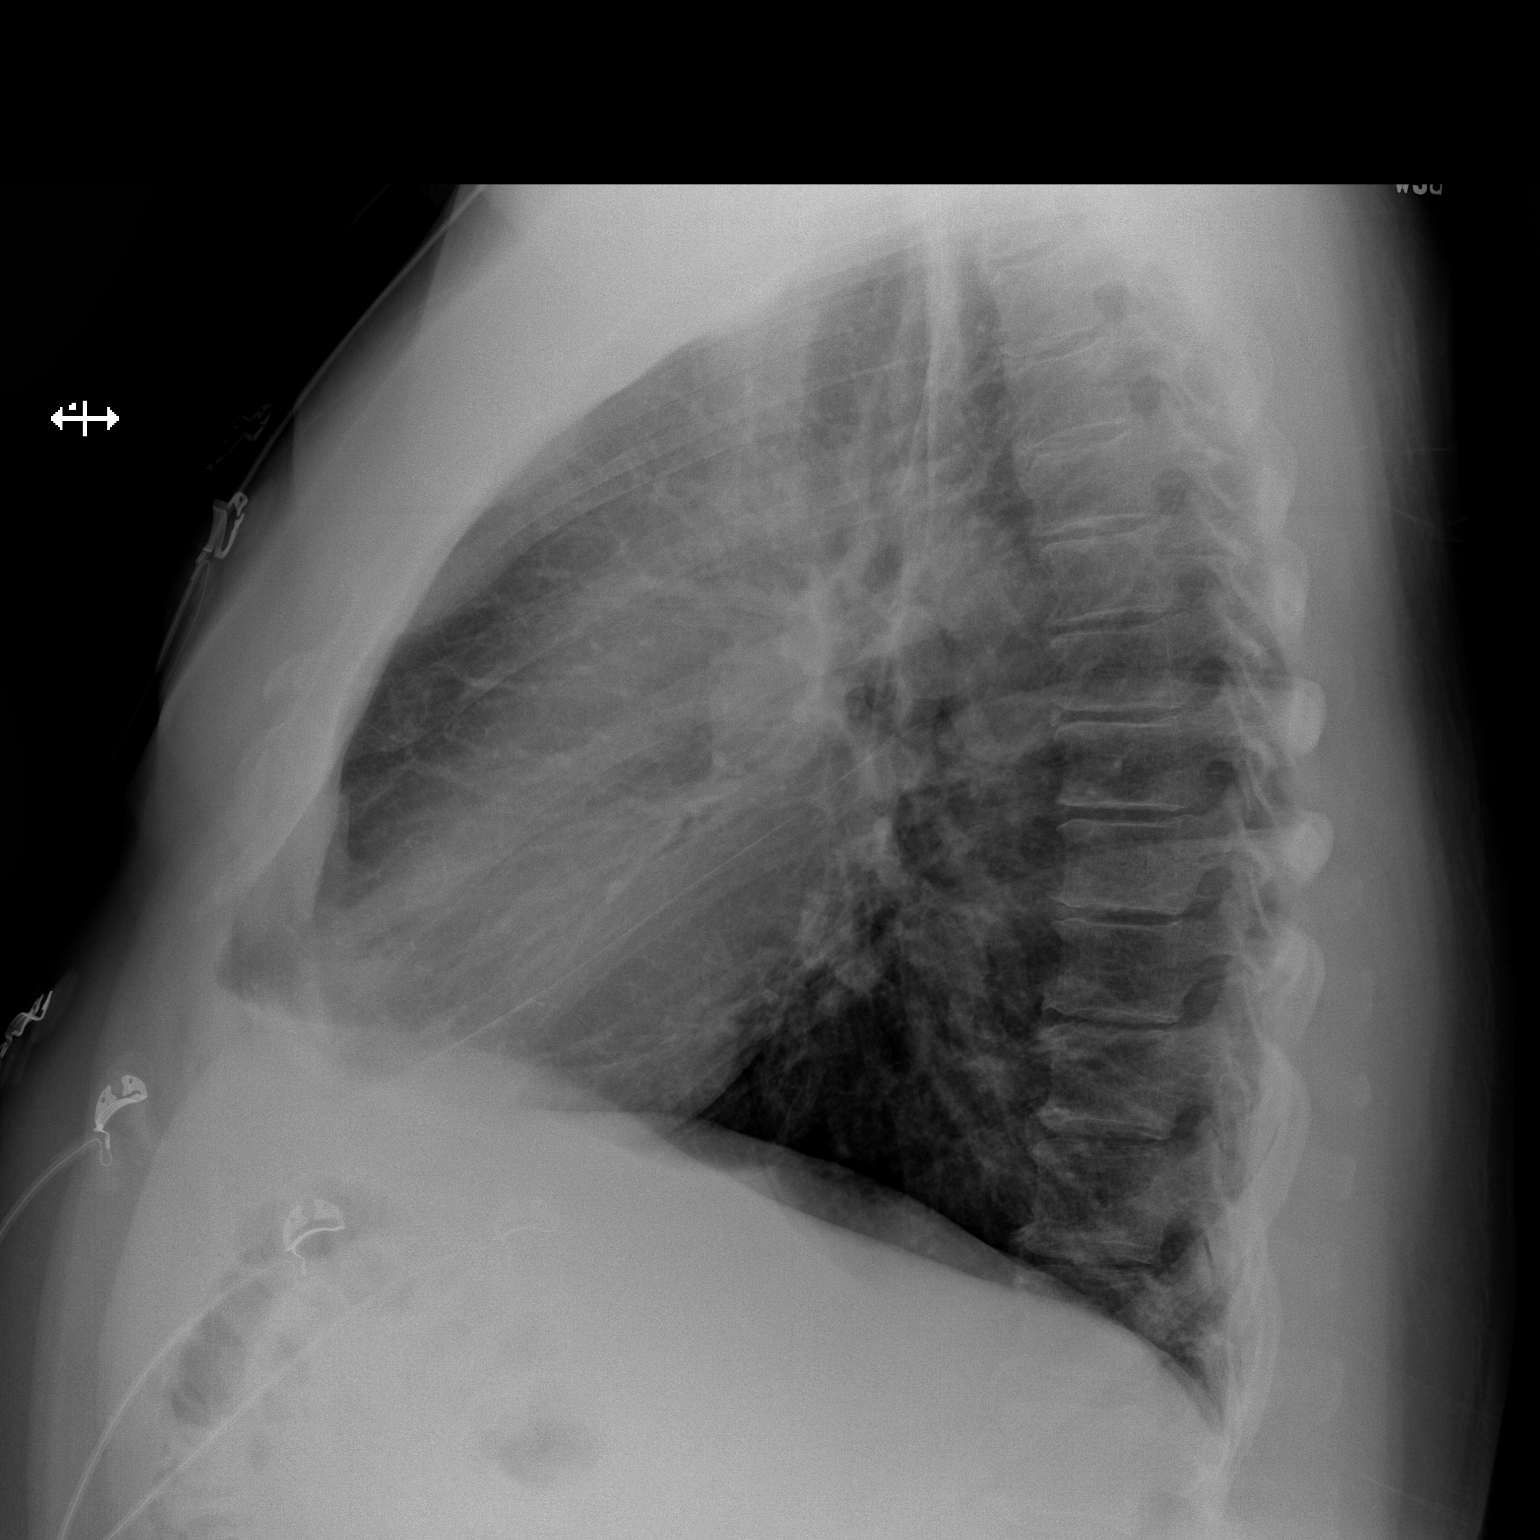

[2 of 2 positions shown; findings below may reference images not displayed]

FINDINGS: Lordotic positioning.

Normal heart size, mediastinal contours, and pulmonary vascularity.

Emphysematous and minimal bronchitic changes consistent with history
of COPD.

No acute infiltrate, pleural effusion, or pneumothorax.

Bones unremarkable.
IMPRESSION: COPD changes.

No acute abnormalities.

## 2017-10-21 IMAGING — CR DG CHEST 2V
2 series · 2 of 2 positions shown · non-contrast
Comparison: 03/24/2016

CLINICAL DATA: Worsening dyspnea over the past week

EXAM:
CHEST  2 VIEW

[w chest pa]
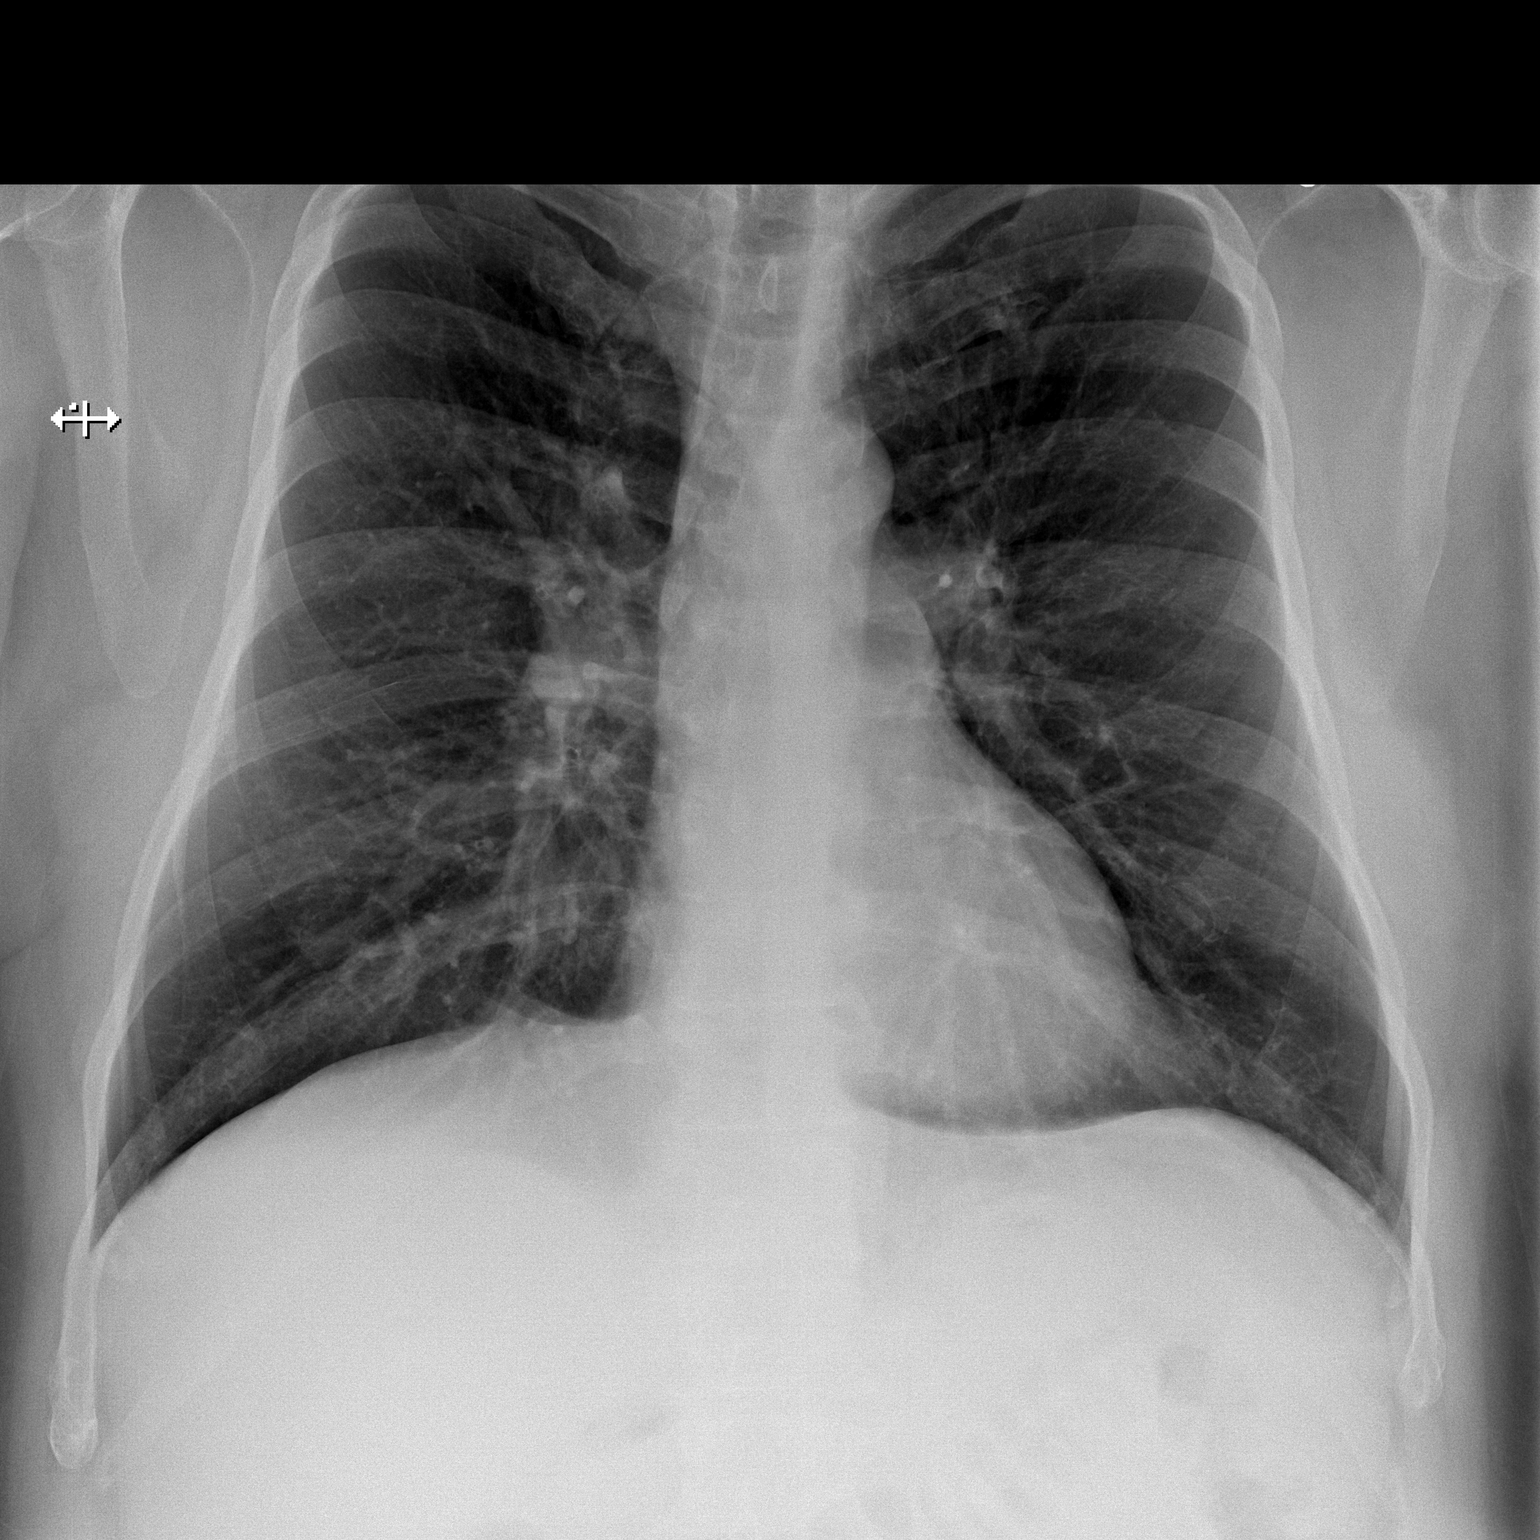

[w chest lat]
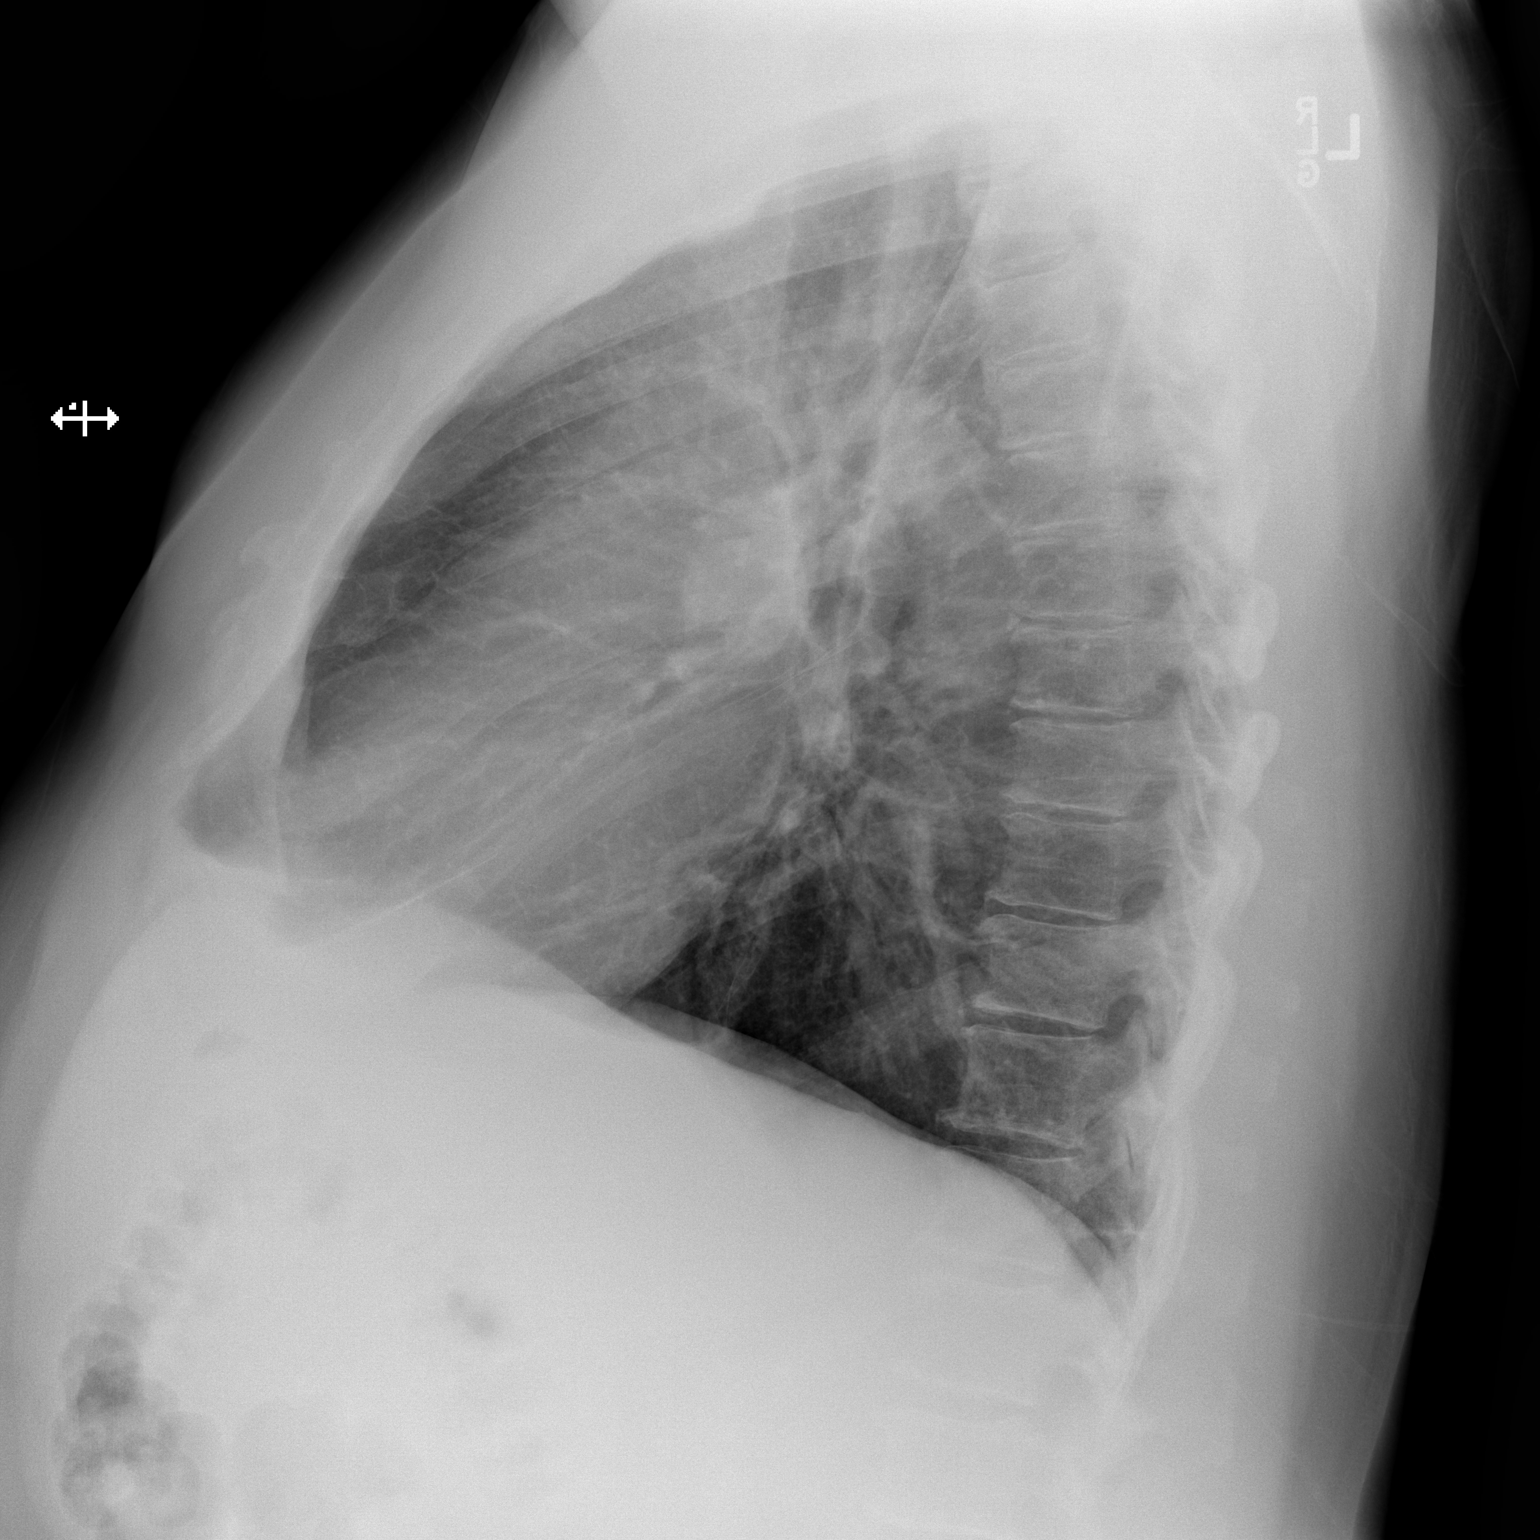

[2 of 2 positions shown; findings below may reference images not displayed]

FINDINGS: The heart size and mediastinal contours are within normal limits.
Both lungs are clear. The visualized skeletal structures are
unremarkable.
IMPRESSION: No active cardiopulmonary disease.
